# Patient Record
Sex: Female | Born: 1955 | Race: White | Hispanic: No | Marital: Married | Smoking: Current every day smoker
Health system: Southern US, Community
[De-identification: ages and names within clinical notes are randomized; demographics above are authoritative.]

---

## 2007-08-26 ENCOUNTER — Ambulatory Visit: Payer: Self-pay | Admitting: Gastroenterology

## 2009-08-25 IMAGING — CR DG CHEST 2V
1 series · 2 of 2 positions shown · non-contrast
Comparison: none

REASON FOR EXAM: esophageal foreign body
COMMENTS:

[Series 1: view not recorded · 0.17mm/px · 2 of 2 slices shown]
[im 1/2]
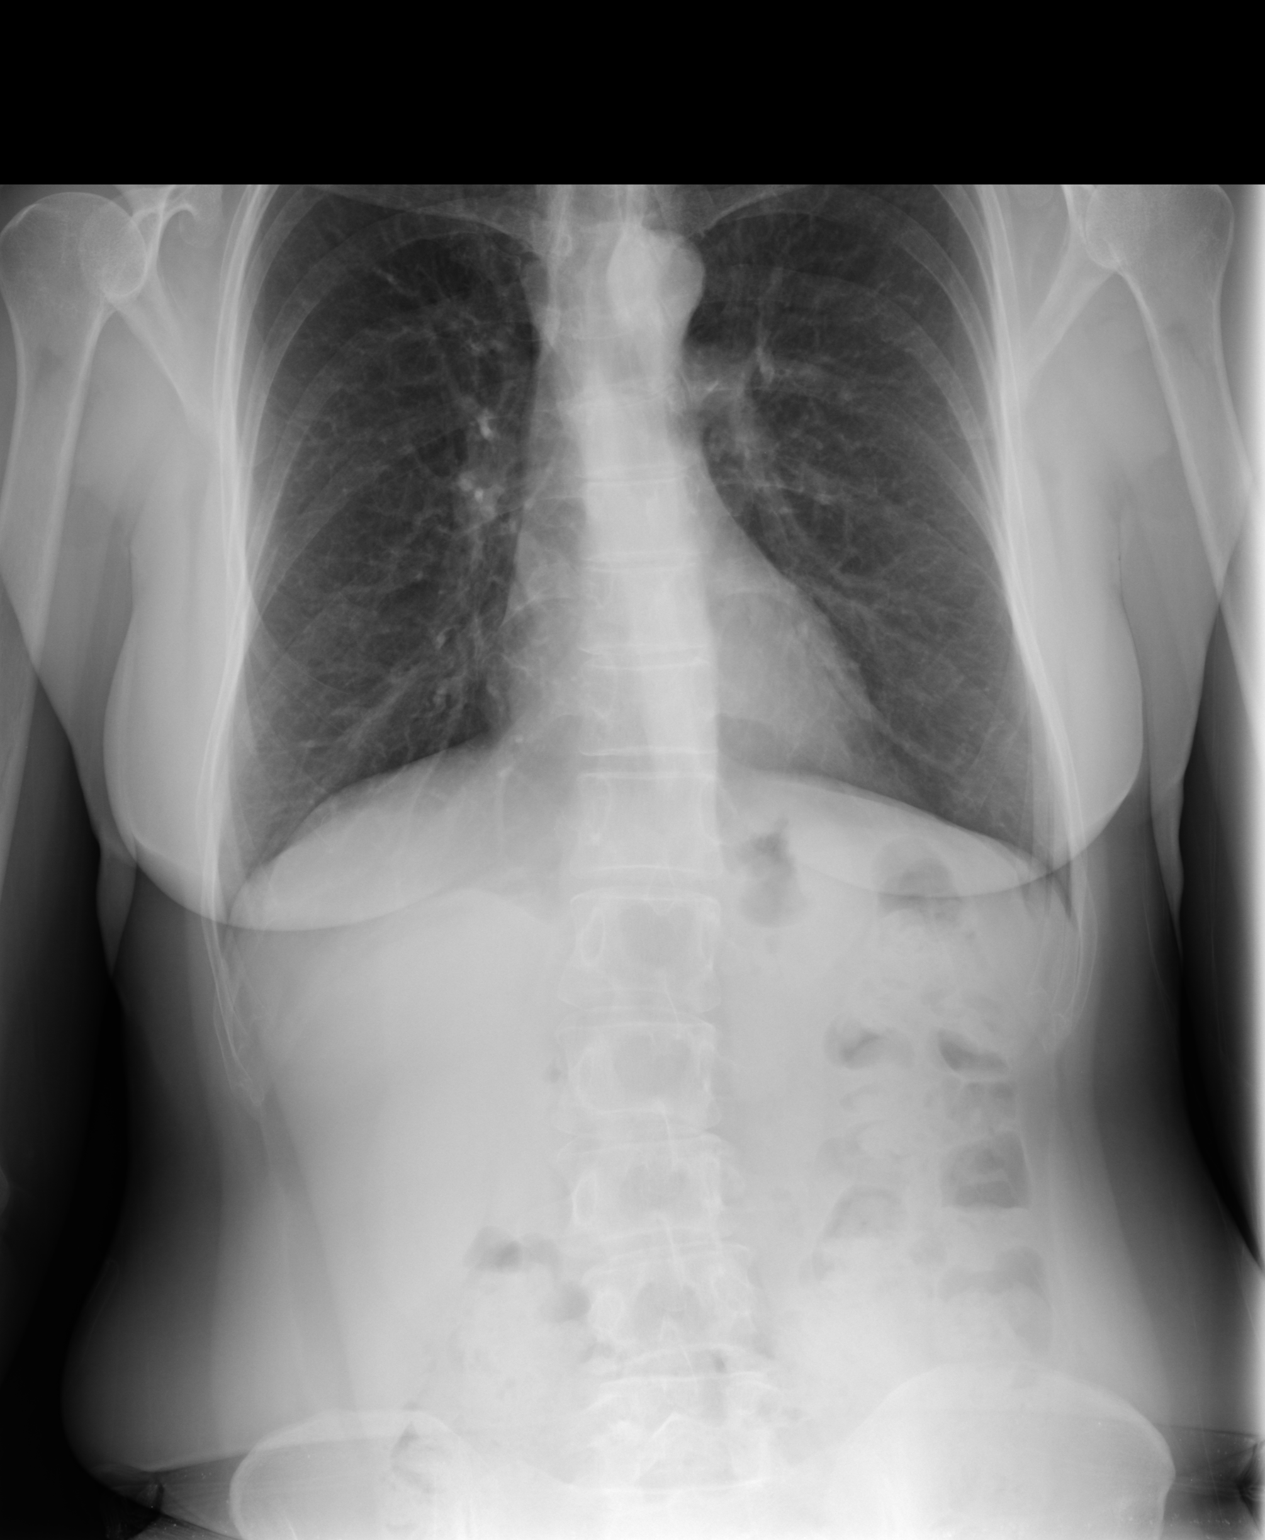
[im 2/2]
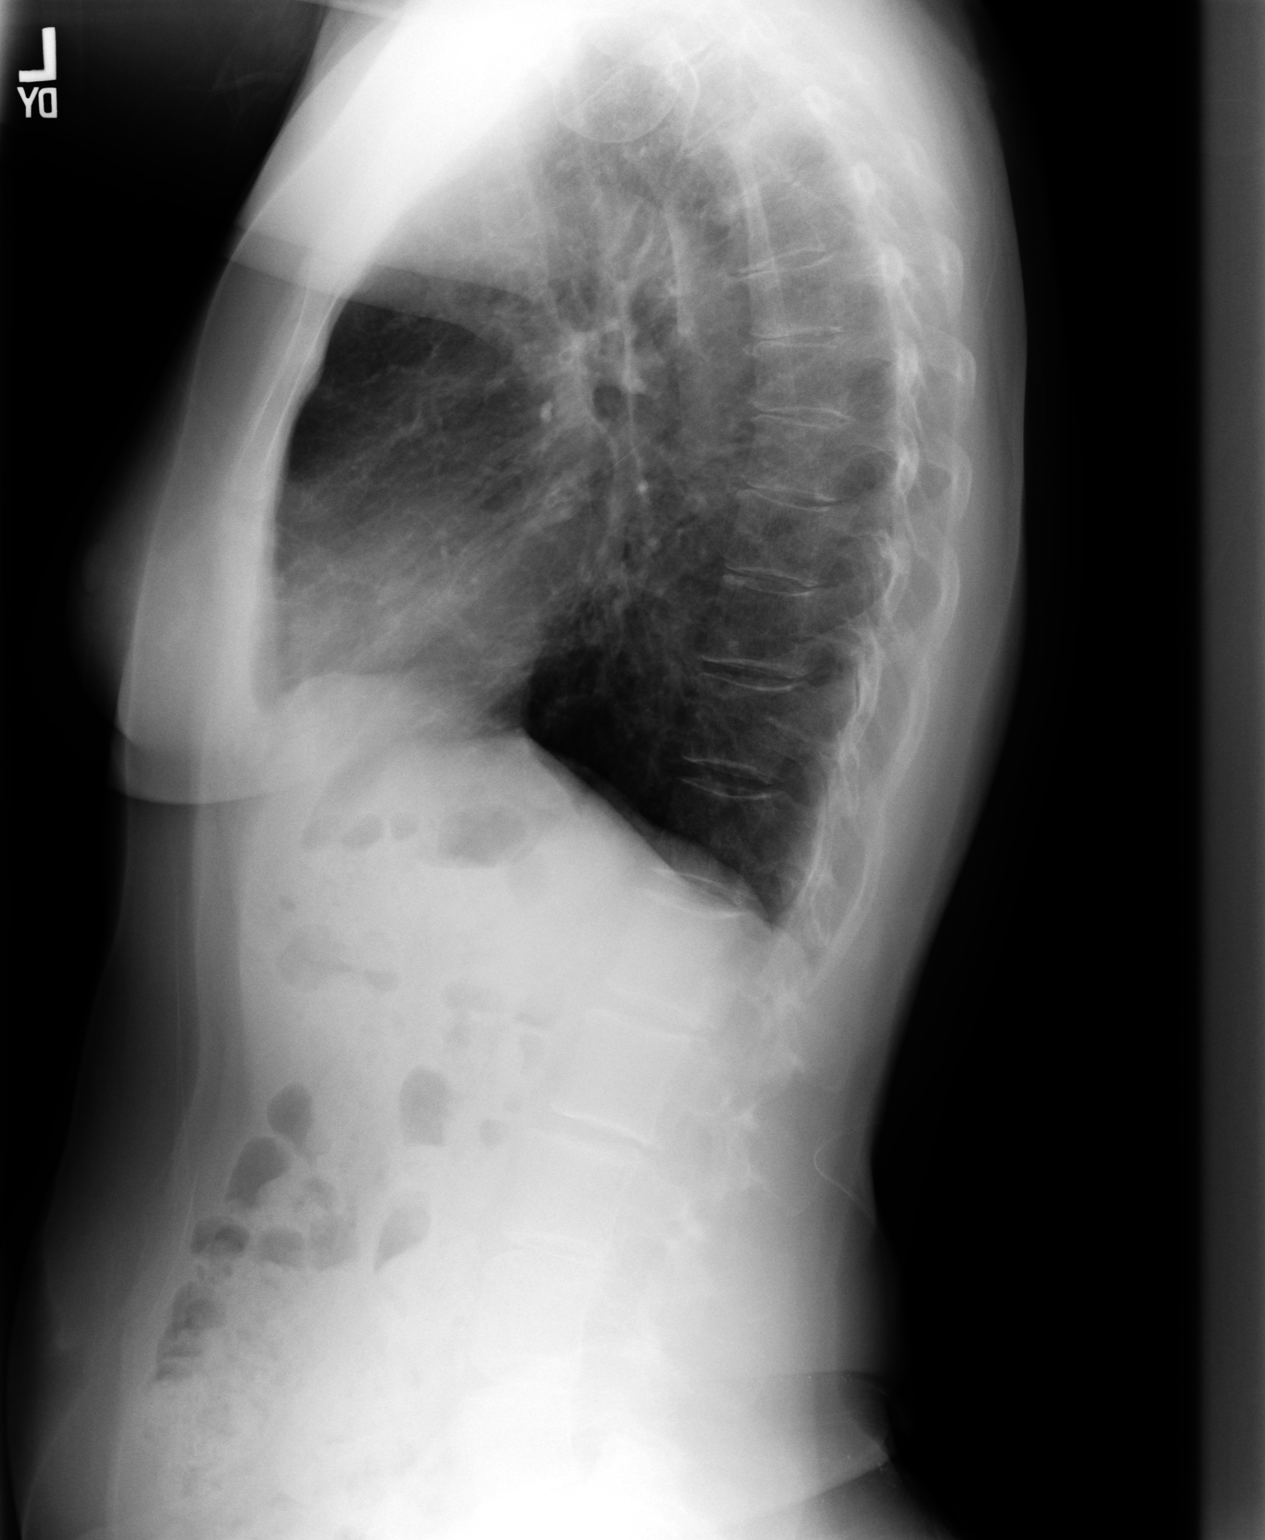

[2 of 2 positions shown; findings below may reference images not displayed]

PROCEDURE:     DXR - DXR CHEST PA (OR AP) AND LATERAL  - August 26, 2007  [DATE]

RESULT:     Mild infiltrate in the RIGHT middle lobe cannot be excluded.
Mild increased markings are noted in this region.  These changes may just be
related to mild atelectasis.  Follow up PA and lateral chest x-ray should be
considered. Cardiovascular structures are unremarkable.
IMPRESSION: Cannot exclude a very mild infiltrate versus atelectasis in the RIGHT middle
lobe. Follow-up PA and lateral chest x-ray is suggested.

## 2022-09-10 ENCOUNTER — Encounter: Payer: Self-pay | Admitting: Intensive Care

## 2022-09-10 ENCOUNTER — Observation Stay
Admission: EM | Admit: 2022-09-10 | Discharge: 2022-09-11 | Disposition: A | Discharge status: Home / Self Care | Payer: Medicare HMO | Attending: Osteopathic Medicine | Admitting: Osteopathic Medicine

## 2022-09-10 ENCOUNTER — Emergency Department: Payer: Medicare HMO

## 2022-09-10 ENCOUNTER — Observation Stay: Payer: Medicare HMO

## 2022-09-10 ENCOUNTER — Other Ambulatory Visit: Payer: Self-pay

## 2022-09-10 DIAGNOSIS — E782 Mixed hyperlipidemia: Secondary | ICD-10-CM | POA: Insufficient documentation

## 2022-09-10 DIAGNOSIS — R7309 Other abnormal glucose: Secondary | ICD-10-CM | POA: Diagnosis present

## 2022-09-10 DIAGNOSIS — I6782 Cerebral ischemia: Secondary | ICD-10-CM | POA: Insufficient documentation

## 2022-09-10 DIAGNOSIS — Z79899 Other long term (current) drug therapy: Secondary | ICD-10-CM | POA: Diagnosis not present

## 2022-09-10 DIAGNOSIS — E785 Hyperlipidemia, unspecified: Secondary | ICD-10-CM | POA: Insufficient documentation

## 2022-09-10 DIAGNOSIS — I639 Cerebral infarction, unspecified: Principal | ICD-10-CM | POA: Insufficient documentation

## 2022-09-10 DIAGNOSIS — R202 Paresthesia of skin: Secondary | ICD-10-CM | POA: Diagnosis not present

## 2022-09-10 DIAGNOSIS — R29818 Other symptoms and signs involving the nervous system: Secondary | ICD-10-CM | POA: Diagnosis not present

## 2022-09-10 DIAGNOSIS — K219 Gastro-esophageal reflux disease without esophagitis: Secondary | ICD-10-CM | POA: Diagnosis present

## 2022-09-10 DIAGNOSIS — F17210 Nicotine dependence, cigarettes, uncomplicated: Secondary | ICD-10-CM | POA: Diagnosis not present

## 2022-09-10 DIAGNOSIS — F419 Anxiety disorder, unspecified: Secondary | ICD-10-CM | POA: Diagnosis not present

## 2022-09-10 DIAGNOSIS — K2100 Gastro-esophageal reflux disease with esophagitis, without bleeding: Secondary | ICD-10-CM | POA: Diagnosis not present

## 2022-09-10 DIAGNOSIS — R7303 Prediabetes: Secondary | ICD-10-CM | POA: Diagnosis not present

## 2022-09-10 DIAGNOSIS — I10 Essential (primary) hypertension: Secondary | ICD-10-CM | POA: Diagnosis not present

## 2022-09-10 DIAGNOSIS — R0981 Nasal congestion: Secondary | ICD-10-CM | POA: Diagnosis not present

## 2022-09-10 DIAGNOSIS — R531 Weakness: Secondary | ICD-10-CM

## 2022-09-10 DIAGNOSIS — R2990 Unspecified symptoms and signs involving the nervous system: Secondary | ICD-10-CM

## 2022-09-10 DIAGNOSIS — I6523 Occlusion and stenosis of bilateral carotid arteries: Secondary | ICD-10-CM | POA: Diagnosis not present

## 2022-09-10 LAB — CBC
HCT: 43.5 % (ref 36.0–46.0)
Hemoglobin: 14.3 g/dL (ref 12.0–15.0)
MCH: 30 pg (ref 26.0–34.0)
MCHC: 32.9 g/dL (ref 30.0–36.0)
MCV: 91.4 fL (ref 80.0–100.0)
Platelets: 246 10*3/uL (ref 150–400)
RBC: 4.76 MIL/uL (ref 3.87–5.11)
RDW: 13 % (ref 11.5–15.5)
WBC: 7.1 10*3/uL (ref 4.0–10.5)
nRBC: 0 % (ref 0.0–0.2)

## 2022-09-10 LAB — COMPREHENSIVE METABOLIC PANEL
ALT: 22 U/L (ref 0–44)
AST: 28 U/L (ref 15–41)
Albumin: 4.3 g/dL (ref 3.5–5.0)
Alkaline Phosphatase: 125 U/L (ref 38–126)
Anion gap: 9 (ref 5–15)
BUN: 11 mg/dL (ref 8–23)
CO2: 24 mmol/L (ref 22–32)
Calcium: 9.2 mg/dL (ref 8.9–10.3)
Chloride: 102 mmol/L (ref 98–111)
Creatinine, Ser: 0.76 mg/dL (ref 0.44–1.00)
GFR, Estimated: >60 mL/min (ref >60)
Glucose, Bld: 112 mg/dL — ABNORMAL HIGH (ref 70–99)
Potassium: 4 mmol/L (ref 3.5–5.1)
Sodium: 135 mmol/L (ref 135–145)
Total Bilirubin: 0.8 mg/dL (ref 0.3–1.2)
Total Protein: 7.4 g/dL (ref 6.5–8.1)

## 2022-09-10 LAB — MAGNESIUM: Magnesium: 2.7 mg/dL — ABNORMAL HIGH (ref 1.7–2.4)

## 2022-09-10 MED ORDER — BISACODYL 10 MG RE SUPP: 10.0000 mg | Freq: Every day | RECTAL | Status: DC | PRN

## 2022-09-10 MED ORDER — ONDANSETRON HCL 4 MG/2ML IJ SOLN: 4.0000 mg | Freq: Four times a day (QID) | INTRAMUSCULAR | Status: DC | PRN

## 2022-09-10 MED ORDER — SALINE SPRAY 0.65 % NA SOLN
1.0000 | NASAL | Status: DC | PRN
  Administered 2022-09-10: 1 via NASAL
  Filled 2022-09-10: qty 44

## 2022-09-10 MED ORDER — CLOPIDOGREL BISULFATE 75 MG PO TABS
300.0000 mg | ORAL_TABLET | Freq: Once | ORAL | Status: AC
  Administered 2022-09-10: 300 mg via ORAL
  Filled 2022-09-10: qty 4

## 2022-09-10 MED ORDER — ALUM & MAG HYDROXIDE-SIMETH 200-200-20 MG/5ML PO SUSP
30.0000 mL | Freq: Once | ORAL | Status: AC | PRN
  Administered 2022-09-10: 30 mL via ORAL
  Filled 2022-09-10: qty 30

## 2022-09-10 MED ORDER — GABAPENTIN 100 MG PO CAPS
100.0000 mg | ORAL_CAPSULE | Freq: Every day | ORAL | Status: DC
  Filled 2022-09-10: qty 1

## 2022-09-10 MED ORDER — CLOPIDOGREL BISULFATE 75 MG PO TABS
75.0000 mg | ORAL_TABLET | Freq: Every day | ORAL | Status: DC
  Administered 2022-09-11: 75 mg via ORAL
  Filled 2022-09-10: qty 1

## 2022-09-10 MED ORDER — SODIUM CHLORIDE 0.9 % IV SOLN: 250.0000 mL | INTRAVENOUS | Status: DC | PRN

## 2022-09-10 MED ORDER — SODIUM CHLORIDE 0.9 % IV SOLN
6.2500 mg | Freq: Once | INTRAVENOUS | Status: AC
  Administered 2022-09-10: 6.25 mg via INTRAVENOUS
  Filled 2022-09-10: qty 0.25

## 2022-09-10 MED ORDER — ASPIRIN 81 MG PO CHEW
324.0000 mg | CHEWABLE_TABLET | Freq: Once | ORAL | Status: AC
  Administered 2022-09-10: 324 mg via ORAL
  Filled 2022-09-10: qty 4

## 2022-09-10 MED ORDER — ONDANSETRON HCL 4 MG PO TABS: 4.0000 mg | ORAL_TABLET | Freq: Four times a day (QID) | ORAL | Status: DC | PRN

## 2022-09-10 MED ORDER — ACETAMINOPHEN 650 MG RE SUPP: 650.0000 mg | Freq: Four times a day (QID) | RECTAL | Status: DC | PRN

## 2022-09-10 MED ORDER — ACETAMINOPHEN 325 MG PO TABS: 650.0000 mg | ORAL_TABLET | Freq: Four times a day (QID) | ORAL | Status: DC | PRN

## 2022-09-10 MED ORDER — SODIUM CHLORIDE 0.9% FLUSH: 3.0000 mL | INTRAVENOUS | Status: DC | PRN

## 2022-09-10 MED ORDER — DIAZEPAM 5 MG PO TABS
5.0000 mg | ORAL_TABLET | Freq: Once | ORAL | Status: AC
  Administered 2022-09-10: 5 mg via ORAL
  Filled 2022-09-10: qty 1

## 2022-09-10 MED ORDER — ENOXAPARIN SODIUM 40 MG/0.4ML IJ SOSY
40.0000 mg | PREFILLED_SYRINGE | INTRAMUSCULAR | Status: DC
  Administered 2022-09-10: 40 mg via SUBCUTANEOUS
  Filled 2022-09-10: qty 0.4

## 2022-09-10 MED ORDER — POLYETHYLENE GLYCOL 3350 17 G PO PACK: 17.0000 g | PACK | Freq: Every day | ORAL | Status: DC | PRN

## 2022-09-10 MED ORDER — STROKE: EARLY STAGES OF RECOVERY BOOK: Freq: Once | Status: AC

## 2022-09-10 MED ORDER — PANTOPRAZOLE SODIUM 40 MG IV SOLR
40.0000 mg | Freq: Once | INTRAVENOUS | Status: AC
  Administered 2022-09-10: 40 mg via INTRAVENOUS
  Filled 2022-09-10: qty 10

## 2022-09-10 MED ORDER — SODIUM CHLORIDE 0.9% FLUSH
3.0000 mL | Freq: Two times a day (BID) | INTRAVENOUS | Status: DC
  Administered 2022-09-10 (×2): 3 mL via INTRAVENOUS

## 2022-09-10 MED ORDER — IOHEXOL 350 MG/ML SOLN
75.0000 mL | Freq: Once | INTRAVENOUS | Status: AC | PRN
  Administered 2022-09-10: 75 mL via INTRAVENOUS

## 2022-09-10 MED ORDER — CLONAZEPAM 0.5 MG PO TABS: 1.0000 mg | ORAL_TABLET | Freq: Three times a day (TID) | ORAL | Status: DC | PRN

## 2022-09-10 NOTE — ED Notes (Addendum)
Pt used walker to walk to toilet, when returning to stretcher pt fell and landed on bottom. This RN was walking by as it happened and assisted pt up with another RN.  She states she is "fine" but endorses pain in her "butt cheek" per pt. No obvious deformities, in NAD. MD notified.

## 2022-09-10 NOTE — H&P (Signed)
HISTORY AND PHYSICAL    Kellie Johnson   [id number] DOB: 09/20/1955   Date of Service: 09/10/22 Requesting physician/APP from ED: Treatment Team:  Attending Provider: Emeterio Reeve, DO  PCP: Patient, No Pcp Per     HPI: Kellie Johnson is a 67 y.o. female without significant PMH d/t does not like going to doctors. She presents after 3 weeks ago numbness/weakness on L side which spontaneously resolved <24h, then recurred yesterday evening and persists until now. She presented to ED 09/10/22, given ASA< CT head negative, L sided weakness and was admitted to hospitalist service     Consultants:  Neurology   Procedures: none  MRI Brain 09/10/22  IMPRESSION: 1. Motion degraded, incomplete examination. 2. 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 3. Moderate chronic small vessel ischemic disease.   CTA H/N 09/10/22  IMPRESSION:  Patent vasculature of the head and neck with mild calcified plaque at the left carotid bifurcation and bilateral carotid siphons but no hemodynamically significant stenosis, occlusion, or dissection     ASSESSMENT & PLAN:   Principal Problem:   Stroke [facility])  Stroke MRI and CTA H/N as above ASA + Plavix Neuro checks and fall precautions PT/OT to see  Echo pending Remain on telemetry  Risk stratification: A1C, Lipids Permissive HTN Neurology consult tomorrow   GERD PPI given today GI cocktail as needed  Anxiety Requiring pre-medication for MRI Prn anxiolytic also ordered  Nasal congestion Saline spray prn      DVT prophylaxis: lovenox Pertinent IV fluids/nutrition: no IV fluids  Central lines / invasive devices: none  Code Status: FULL CODE Family Communication: daughter at bedside on admission  Disposition: observation TOC needs: pend PT/OT Barriers to discharge / significant pending items: echo, telemetry, neurology consult                    Review of Systems:  Review of Systems  Constitutional:  Negative for chills, fever, malaise/fatigue and weight loss.  HENT:  Positive for congestion and sinus pain. Negative for sore throat.   Respiratory:  Negative for cough, hemoptysis, sputum production, shortness of breath and wheezing.   Cardiovascular:  Negative for chest pain, palpitations, orthopnea, claudication and leg swelling.  Gastrointestinal:  Positive for heartburn. Negative for abdominal pain, constipation, diarrhea, nausea and vomiting.  Genitourinary:  Negative for dysuria and frequency.  Musculoskeletal:  Positive for falls.  Skin:  Negative for rash.  Neurological:  Positive for tingling, sensory change, focal weakness and weakness. Negative for dizziness, tremors, speech change, seizures, loss of consciousness and headaches.  Psychiatric/Behavioral:  Negative for depression and suicidal ideas. The patient is nervous/anxious.        has no past medical history on file. (Not in an outpatient encounter)   No Known Allergies    family history is not on file. History reviewed. No pertinent surgical history.        Objective Findings:  Vitals:   09/10/22 0940 09/10/22 1245 09/10/22 1409 09/10/22 1612  BP:  (!) 164/78 (!) 143/73 135/85  Pulse:   93 89  Resp:  '20 20 16  '$ Temp:   98.2 F (36.8 C) 98 F (36.7 C)  TempSrc:      SpO2:  98% 97% 94%  Weight: 61.2 kg     Height: 5' (1.524 m)       Intake/Output Summary (Last 24 hours) at 09/10/2022 1831 Last data filed at 09/10/2022 1632 Gross per  24 hour  Intake 50 ml  Output --  Net 50 ml   Filed Weights   09/10/22 0940  Weight: 61.2 kg    Examination:  Physical Exam Constitutional:      General: She is not in acute distress.    Appearance: Normal appearance.  HENT:     Head: Normocephalic and atraumatic.  Eyes:     Extraocular Movements: Extraocular movements intact.     Pupils: Pupils are equal, round, and  reactive to light.  Cardiovascular:     Rate and Rhythm: Normal rate and regular rhythm.     Pulses: Normal pulses.     Heart sounds: Normal heart sounds.  Pulmonary:     Effort: Pulmonary effort is normal. No respiratory distress.     Breath sounds: Normal breath sounds.  Abdominal:     General: Abdomen is flat. Bowel sounds are normal.     Palpations: Abdomen is soft.  Musculoskeletal:     Cervical back: Normal range of motion. No rigidity.     Right lower leg: No edema.     Left lower leg: No edema.  Neurological:     Mental Status: She is alert and oriented to person, place, and time.     Cranial Nerves: No cranial nerve deficit.     Sensory: Sensory deficit (reports more numb on her L lateral leg) present.     Motor: Weakness (LLE significant) present.  Psychiatric:        Behavior: Behavior normal.          Scheduled Medications:   [START ON 09/11/2022]  stroke: early stages of recovery book   Does not apply Once   enoxaparin (LOVENOX) injection  40 mg Subcutaneous Q24H   sodium chloride flush  3 mL Intravenous Q12H    Continuous Infusions:  sodium chloride      PRN Medications:  sodium chloride, acetaminophen **OR** acetaminophen, bisacodyl, ondansetron **OR** ondansetron (ZOFRAN) IV, polyethylene glycol, sodium chloride, sodium chloride flush  Antimicrobials:  Anti-infectives (From admission, onward)    None           Data Reviewed: I have personally reviewed following labs and imaging studies  CBC: Recent Labs  Lab 09/10/22 1001  WBC 7.1  HGB 14.3  HCT 43.5  MCV 91.4  PLT 0000000   Basic Metabolic Panel: Recent Labs  Lab 09/10/22 1001  NA 135  K 4.0  CL 102  CO2 24  GLUCOSE 112*  BUN 11  CREATININE 0.76  CALCIUM 9.2  MG 2.7*   GFR: Estimated Creatinine Clearance: 55.8 mL/min (by C-G formula based on SCr of 0.76 mg/dL). Liver Function Tests: Recent Labs  Lab 09/10/22 1001  AST 28  ALT 22  ALKPHOS 125  BILITOT 0.8  PROT 7.4   ALBUMIN 4.3   No results for input(s): "LIPASE", "AMYLASE" in the last 168 hours. No results for input(s): "AMMONIA" in the last 168 hours. Coagulation Profile: No results for input(s): "INR", "PROTIME" in the last 168 hours. Cardiac Enzymes: No results for input(s): "CKTOTAL", "CKMB", "CKMBINDEX", "TROPONINI" in the last 168 hours. BNP (last 3 results) No results for input(s): "PROBNP" in the last 8760 hours. HbA1C: No results for input(s): "HGBA1C" in the last 72 hours. CBG: No results for input(s): "GLUCAP" in the last 168 hours. Lipid Profile: No results for input(s): "CHOL", "HDL", "LDLCALC", "TRIG", "CHOLHDL", "LDLDIRECT" in the last 72 hours. Thyroid Function Tests: No results for input(s): "TSH", "T4TOTAL", "FREET4", "T3FREE", "THYROIDAB" in the last 72 hours.  Anemia Panel: No results for input(s): "VITAMINB12", "FOLATE", "FERRITIN", "TIBC", "IRON", "RETICCTPCT" in the last 72 hours. Most Recent Urinalysis On File:  No results found for: "COLORURINE", "APPEARANCEUR", "LABSPEC", "PHURINE", "GLUCOSEU", "HGBUR", "BILIRUBINUR", "KETONESUR", "PROTEINUR", "UROBILINOGEN", "NITRITE", "LEUKOCYTESUR" Sepsis Labs: '@LABRCNTIP'$ (procalcitonin:4,lacticidven:4)  No results found for this or any previous visit (from the past 240 hour(s)).       Radiology Studies: MR BRAIN WO CONTRAST  Result Date: 09/10/2022 CLINICAL DATA:  Neuro deficit, acute, stroke suspected. Left hemibody paresthesias. EXAM: MRI HEAD WITHOUT CONTRAST TECHNIQUE: Multiplanar, multiecho pulse sequences of the brain and surrounding structures were obtained without intravenous contrast. COMPARISON:  Head CT and CTA 09/10/2022 FINDINGS: The patient terminated the examination prior to completion. Axial and coronal diffusion, axial T2, axial FLAIR, and T2* gradient echo sequences were obtained and are mildly motion degraded. Brain: There is a 1 cm focus of mildly restricted diffusion involving the posterior limb of the right  internal capsule and lateral thalamus consistent with an acute to early subacute infarct. Patchy T2 hyperintensities elsewhere in the cerebral white matter bilaterally are nonspecific but compatible with moderate chronic small vessel ischemic disease, asymmetrically prominent in the left frontal lobe. No intracranial hemorrhage, mass effect, or extra-axial fluid collection is identified. The ventricles and sulci are within normal limits for age. Vascular: Major intracranial vascular flow voids are preserved. Skull and upper cervical spine: No destructive skull lesion. Sinuses/Orbits: Unremarkable orbits. Small mucous retention cyst in the right sphenoid sinus. Trace left mastoid effusion. Other: None. IMPRESSION: 1. Motion degraded, incomplete examination. 2. 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 3. Moderate chronic small vessel ischemic disease. Electronically Signed   By: Logan Bores M.D.   On: 09/10/2022 17:27   CT ANGIO HEAD NECK W WO CM  Result Date: 09/10/2022 CLINICAL DATA:  Weakness and numbness. EXAM: CT ANGIOGRAPHY HEAD AND NECK TECHNIQUE: Multidetector CT imaging of the head and neck was performed using the standard protocol during bolus administration of intravenous contrast. Multiplanar CT image reconstructions and MIPs were obtained to evaluate the vascular anatomy. Carotid stenosis measurements (when applicable) are obtained utilizing NASCET criteria, using the distal internal carotid diameter as the denominator. RADIATION DOSE REDUCTION: This exam was performed according to the departmental dose-optimization program which includes automated exposure control, adjustment of the mA and/or kV according to patient size and/or use of iterative reconstruction technique. CONTRAST:  49m OMNIPAQUE IOHEXOL 350 MG/ML SOLN COMPARISON:  Same-day noncontrast CT head FINDINGS: CTA NECK FINDINGS Aortic arch: The aortic arch is normal. The origins  of the major branch vessels are patent. The subclavian arteries are patent to the level imaged. Right carotid system: The right common, internal, and external carotid arteries are patent, without hemodynamically significant stenosis or occlusion. There is no evidence of dissection or aneurysm. There is mild tortuosity of the internal carotid artery in the neck. Left carotid system: The left common, internal, and external carotid arteries are patent, with mild plaque at the bifurcation resulting in less than 50% stenosis. The distal internal carotid artery is patent with tortuosity. The external carotid artery is patent. There is no evidence of dissection or aneurysm. Vertebral arteries: The vertebral arteries are patent, without hemodynamically significant stenosis or occlusion there is no evidence of dissection or aneurysm. Skeleton: There is degenerative change of the cervical spine with 4 mm anterolisthesis of C2 on C3 and multilevel disc space narrowing and degenerative endplate change, most advanced at C3-C4 and C6-C7. There is no  acute osseous abnormality or suspicious osseous lesion. There is no visible canal hematoma. Other neck: The soft tissues of the neck are unremarkable. Upper chest: There is no acute finding in the lung apices. There is mild scarring/bullous change along the medial right lung apex. Review of the MIP images confirms the above findings CTA HEAD FINDINGS Anterior circulation: There is mild calcified plaque in the carotid siphons without significant stenosis or occlusion. The bilateral MCAs are patent, without proximal stenosis or occlusion. The bilateral ACAs are patent, without proximal stenosis or occlusion. The anterior communicating artery is normal. There is no aneurysm or AVM. Posterior circulation: The bilateral V4 segments are patent. The basilar artery is patent. The major cerebellar arteries appear patent. The bilateral PCAs are patent, without proximal stenosis or occlusion.  Bilateral posterior communicating arteries are identified. There is no aneurysm or AVM. Venous sinuses: As permitted by contrast timing, patent. Anatomic variants: None. Review of the MIP images confirms the above findings IMPRESSION: Patent vasculature of the head and neck with mild calcified plaque at the left carotid bifurcation and bilateral carotid siphons but no hemodynamically significant stenosis, occlusion, or dissection. Electronically Signed   By: Valetta Mole M.D.   On: 09/10/2022 13:00   CT Head Wo Contrast  Result Date: 09/10/2022 CLINICAL DATA:  Neuro deficit, acute, stroke suspected EXAM: CT HEAD WITHOUT CONTRAST TECHNIQUE: Contiguous axial images were obtained from the base of the skull through the vertex without intravenous contrast. RADIATION DOSE REDUCTION: This exam was performed according to the departmental dose-optimization program which includes automated exposure control, adjustment of the mA and/or kV according to patient size and/or use of iterative reconstruction technique. COMPARISON:  None Available. FINDINGS: Brain: No evidence of acute large vascular territory infarction, hemorrhage, hydrocephalus, extra-axial collection or mass lesion/mass effect. Vascular: No hyperdense vessel. Skull: No acute fracture. Sinuses/Orbits: Clear sinuses.  No acute orbital findings. Other: No mastoid effusions. IMPRESSION: No evidence of acute intracranial abnormality. Electronically Signed   By: Margaretha Sheffield M.D.   On: 09/10/2022 10:39             LOS: 0 days       Emeterio Reeve, DO [facility] 09/10/2022, 6:31 PM    Dictation software may have been used to generate the above note. Typos may occur and escape review in typed/dictated notes. Please contact Dr Sheppard Coil directly for clarity if needed.  Staff may message me via secure chat in [place]  but this may not receive an immediate response,  please page me for urgent matters!  If 7PM-7AM, please contact  night coverage [email or web address]

## 2022-09-10 NOTE — ED Triage Notes (Signed)
Patient reports 3 weeks ago her left arm and leg went numb and relieved with aleve. Last night around 7:30pm her left arm and leg went numb again and is still currently numb. Reports she had to crawl to restroom because she could not ambulate with her walker due to numbness.

## 2022-09-10 NOTE — ED Notes (Signed)
First nurse note: Pt here from [facility] with left sided weakness and numbness. Pt states this happened 3 weeks ago as well and went away. Pt states s/s started at [place] last night and pt went to bed afterwards and woke up with same s/s. Pt states Aleve is helping the pain. Pt is A&Ox4. NAD noted.

## 2022-09-10 NOTE — ED Provider Notes (Signed)
[facility] Provider Note    Event Date/Time   First MD Initiated Contact with Patient 09/10/22 [phone]     (approximate)   History   Numbness   HPI  Kellie Johnson is a 67 y.o. female with no significant past medical history who presents to the emergency department with paresthesias to the left hemibody.  Patient states that 3 weeks ago she developed numbness and tingling sensation to her left hemibody.  Use warm compresses and states that her symptoms progressively improved with warm compresses and aspirin over the next couple of weeks.  States that she was no longer having paresthesias and they returned again last night at 7 PM.  Endorses numbness and tingling to the left arm and leg.  No recent falls or trauma.  No headache.  Denies change in vision or slurring of speech.  Denies any extremity weakness.  Does not take any daily medications and states that she otherwise has a distrust for doctors and medicine.  States that she is willing to be admitted to the hospital for further stroke workup.     Physical Exam   Triage Vital Signs: ED Triage Vitals  Enc Vitals Group     BP 09/10/22 0937 125/66     Pulse Rate 09/10/22 0937 79     Resp 09/10/22 0937 16     Temp 09/10/22 0937 98.2 F (36.8 C)     Temp Source 09/10/22 0937 Oral     SpO2 09/10/22 0937 92 %     Weight 09/10/22 0940 135 lb (61.2 kg)     Height 09/10/22 0940 5' (1.524 m)     Head Circumference --      Peak Flow --      Pain Score 09/10/22 0940 0     Pain Loc --      Pain Edu? --      Excl. in [place]? --     Most recent vital signs: Vitals:   09/10/22 0937  BP: 125/66  Pulse: 79  Resp: 16  Temp: 98.2 F (36.8 C)  SpO2: 92%    Physical Exam Constitutional:      Appearance: She is well-developed.  HENT:     Head: Atraumatic.  Eyes:     Extraocular Movements: Extraocular movements intact.     Conjunctiva/sclera: Conjunctivae normal.  Cardiovascular:     Rate and Rhythm:  Regular rhythm.  Pulmonary:     Effort: No respiratory distress.  Abdominal:     General: There is no distension.  Musculoskeletal:        General: Normal range of motion.     Cervical back: Normal range of motion.  Skin:    General: Skin is warm.  Neurological:     Mental Status: She is alert.     GCS: GCS eye subscore is 4. GCS verbal subscore is 5. GCS motor subscore is 6.     Cranial Nerves: Cranial nerves 2-12 are intact.     Sensory: Sensory deficit present.     Motor: Weakness (4+/5 LLE, 5/5 RLE) and pronator drift (Left pronator drift) present.     Comments: Decree sensation but intact to the left upper and lower extremity     IMPRESSION / MDM / [place] / ED COURSE  I reviewed the triage vital signs and the nursing notes.  Differential diagnosis including intracranial hemorrhage, CVA, electrolyte abnormalities.  Patient with an NIH scale of 3.  Patient is outside of the  window for TNK.  Low risk for LVO, do not feel that CT angiography is necessary at this time.   EKG  I, Nathaniel Man, the attending physician, personally viewed and interpreted this ECG.   Rate: Normal  Rhythm: Normal sinus  Axis: Normal  Intervals: Normal  ST&T Change: None  No tachycardic or bradycardic dysrhythmias while on cardiac telemetry.  RADIOLOGY I independently reviewed imaging, my interpretation of imaging: CT scan of the head with no signs of intracranial hemorrhage or infarction.  Read as no acute findings.  LABS (all labs ordered are listed, but only abnormal results are displayed) Labs interpreted as -    Labs Reviewed  COMPREHENSIVE METABOLIC PANEL - Abnormal; Notable for the following components:      Result Value   Glucose, Bld 112 (*)    All other components within normal limits  MAGNESIUM - Abnormal; Notable for the following components:   Magnesium 2.7 (*)    All other components within normal limits  CBC    TREATMENT  Aspirin 325  MDM   Patient  with focal deficits of left-sided paresthesias and weakness with a pronator drift on exam.  Patient is outside of the window for TNK.  Clinical picture most concerning for CVA.  Elevated magnesium level.  No daily medications.  No other significant electrolyte abnormalities.  Consulted hospitalist for admission for CVA.  PROCEDURES:  Critical Care performed: No  Procedures  Patient's presentation is most consistent with acute presentation with potential threat to life or bodily function.   MEDICATIONS ORDERED IN ED: Medications  aspirin chewable tablet 324 mg (324 mg Oral Given 09/10/22 1109)    FINAL CLINICAL IMPRESSION(S) / ED DIAGNOSES   Final diagnoses:  Paresthesias  Weakness  Stroke-like symptoms     Rx / DC Orders   ED Discharge Orders     None        Note:  This document was prepared using Dragon voice recognition software and may include unintentional dictation errors.   Nathaniel Man, MD 09/10/22 1110

## 2022-09-11 ENCOUNTER — Observation Stay (HOSPITAL_BASED_OUTPATIENT_CLINIC_OR_DEPARTMENT_OTHER)
Admit: 2022-09-11 | Discharge: 2022-09-11 | Disposition: A | Discharge status: Home / Self Care | Payer: Medicare HMO | Attending: Osteopathic Medicine | Admitting: Osteopathic Medicine

## 2022-09-11 DIAGNOSIS — K2100 Gastro-esophageal reflux disease with esophagitis, without bleeding: Secondary | ICD-10-CM

## 2022-09-11 DIAGNOSIS — I6389 Other cerebral infarction: Secondary | ICD-10-CM | POA: Diagnosis not present

## 2022-09-11 DIAGNOSIS — E782 Mixed hyperlipidemia: Secondary | ICD-10-CM | POA: Diagnosis present

## 2022-09-11 DIAGNOSIS — R202 Paresthesia of skin: Secondary | ICD-10-CM | POA: Diagnosis not present

## 2022-09-11 DIAGNOSIS — R7309 Other abnormal glucose: Secondary | ICD-10-CM | POA: Diagnosis not present

## 2022-09-11 DIAGNOSIS — I6381 Other cerebral infarction due to occlusion or stenosis of small artery: Secondary | ICD-10-CM | POA: Diagnosis not present

## 2022-09-11 DIAGNOSIS — F17210 Nicotine dependence, cigarettes, uncomplicated: Secondary | ICD-10-CM

## 2022-09-11 DIAGNOSIS — I639 Cerebral infarction, unspecified: Secondary | ICD-10-CM | POA: Diagnosis not present

## 2022-09-11 DIAGNOSIS — K219 Gastro-esophageal reflux disease without esophagitis: Secondary | ICD-10-CM | POA: Diagnosis present

## 2022-09-11 DIAGNOSIS — R531 Weakness: Secondary | ICD-10-CM

## 2022-09-11 LAB — LIPID PANEL
Cholesterol: 300 mg/dL — ABNORMAL HIGH (ref 0–200)
HDL: 46 mg/dL (ref >40)
LDL Cholesterol: 184 mg/dL — ABNORMAL HIGH (ref 0–99)
Total CHOL/HDL Ratio: 6.5 RATIO
Triglycerides: 351 mg/dL — ABNORMAL HIGH (ref <150)
VLDL: 70 mg/dL — ABNORMAL HIGH (ref 0–40)

## 2022-09-11 LAB — ECHOCARDIOGRAM COMPLETE
AR max vel: 1.55 cm2
AV Peak grad: 7 mmHg
Ao pk vel: 1.32 m/s
Area-P 1/2: 3.6 cm2
Height: 60 in
S' Lateral: 3 cm
Single Plane A4C EF: 63 %
Weight: 2160 oz

## 2022-09-11 LAB — HEMOGLOBIN A1C
Hgb A1c MFr Bld: 5.9 % — ABNORMAL HIGH (ref 4.8–5.6)
Mean Plasma Glucose: 123 mg/dL

## 2022-09-11 MED ORDER — PANTOPRAZOLE SODIUM 40 MG PO TBEC: 40.0000 mg | DELAYED_RELEASE_TABLET | Freq: Every day | ORAL | 0 refills | Status: AC

## 2022-09-11 MED ORDER — ASPIRIN 81 MG PO TBEC: 81.0000 mg | DELAYED_RELEASE_TABLET | Freq: Every day | ORAL | 0 refills | Status: AC

## 2022-09-11 MED ORDER — ROSUVASTATIN CALCIUM 20 MG PO TABS: 20.0000 mg | ORAL_TABLET | Freq: Every day | ORAL | 0 refills | Status: AC

## 2022-09-11 MED ORDER — ROSUVASTATIN CALCIUM 20 MG PO TABS: 20.0000 mg | ORAL_TABLET | Freq: Every day | ORAL | Status: DC

## 2022-09-11 MED ORDER — CLOPIDOGREL BISULFATE 75 MG PO TABS: 75.0000 mg | ORAL_TABLET | Freq: Every day | ORAL | 0 refills | Status: AC

## 2022-09-11 NOTE — Discharge Summary (Signed)
Physician Discharge Summary   Patient: Kellie Johnson MRN: [id number]  DOB: [birth year]   Admit:     Date of Admission: 09/10/2022 Admitted from: home   Discharge: Date of discharge: 09/11/22 Disposition: Home Condition at discharge: good  CODE STATUS: FULL CODE     Discharge Physician: Emeterio Reeve, DO [facility]     PCP: Patient, No Pcp Per  Recommendations for Outpatient Follow-up:  Follow up with PCP Patient, No Pcp Per in 3-4 weeks Please obtain labs/tests: CMP, lipids in 4-6 weeks, A1C in 3 mos  Please follow up on the following pending results: none PCP AND OTHER OUTPATIENT PROVIDERS: SEE BELOW FOR SPECIFIC DISCHARGE INSTRUCTIONS PRINTED FOR PATIENT IN ADDITION TO GENERIC AVS PATIENT INFO    Discharge Instructions     Increase activity slowly   Complete by: As directed          Discharge Diagnoses: Principal Problem:   Stroke [facility]) Active Problems:   GERD (gastroesophageal reflux disease)   Mixed hyperlipidemia   Abnormal glucose - A1C x1 in prediabetic range       Hospital Course: Kellie Johnson is a 67 y.o. female without significant PMH d/t does not like going to doctors. She presents after 3 weeks ago numbness/weakness on L side which spontaneously resolved <24h, then recurred yesterday evening and persists until now. She presented to ED 09/10/22, given ASA, CT head negative, L sided weakness and was admitted to hospitalist service. MRI and CTA H/N as above. No concerns on Echo. D/c on meds as below w/ appropriate DME   Consultants:  Neurology   Procedures: none  MRI Brain 09/10/22  IMPRESSION: 1. Motion degraded, incomplete examination. 2. 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 3. Moderate chronic small vessel ischemic disease.   CTA H/N 09/10/22  IMPRESSION:  Patent vasculature of the head and neck with mild calcified plaque at the left carotid  bifurcation and bilateral carotid siphons but no hemodynamically significant stenosis, occlusion, or dissection    ASSESSMENT & PLAN:   Stroke MRI and CTA H/N as above ASA + Plavix - DAPT x3 weeks then ASA monotherapy  Statin - follow CMP and lipids   HLD in setting of CVA High potency statin  Will require outpatient f/u  Abn Glc level - A1C 5.9 Will require outpatient f/u   GERD GI cocktail as needed PPI rx on discharge Follow outpatient    Anxiety Requiring pre-medication for MRI Prn anxiolytic also ordered Follow outpatient but low suspicion for GAD    Nasal congestion Saline spray prn               Discharge Instructions  Allergies as of 09/11/2022   No Known Allergies      Medication List     TAKE these medications    aspirin EC 81 MG tablet Take 1 tablet (81 mg total) by mouth daily.   clopidogrel 75 MG tablet Commonly known as: PLAVIX Take 1 tablet (75 mg total) by mouth daily for 21 days. Start taking on: September 12, 2022   pantoprazole 40 MG tablet Commonly known as: Protonix Take 1 tablet (40 mg total) by mouth daily.   rosuvastatin 20 MG tablet Commonly known as: CRESTOR Take 1 tablet (20 mg total) by mouth at bedtime. Start taking on: September 12, 2022               Durable Medical Equipment  (From admission,  onward)           Start     Ordered   09/11/22 1118  For home use only DME Walker rolling  Once       Question Answer Comment  Walker: With [place]   Patient needs a walker to treat with the following condition Generalized weakness      09/11/22 1118              No Known Allergies   Subjective: pt feeling well this mornign and eager to go home   Discharge Exam: BP (!) 134/52 (BP Location: Left Arm)   Pulse 87   Temp 98.7 F (37.1 C) (Oral)   Resp 17   Ht 5' (1.524 m)   Wt 61.2 kg   SpO2 96%   BMI 26.37 kg/m  General: Pt is alert, awake, not in acute distress Cardiovascular: RRR,  S1/S2 +, no rubs, no gallops Respiratory: CTA bilaterally, no wheezing, no rhonchi Abdominal: Soft, NT, ND, bowel sounds + Extremities: no edema, no cyanosis     The results of significant diagnostics from this hospitalization (including imaging, microbiology, ancillary and laboratory) are listed below for reference.     Microbiology: No results found for this or any previous visit (from the past 240 hour(s)).   Labs: BNP (last 3 results) No results for input(s): "BNP" in the last 8760 hours. Basic Metabolic Panel: Recent Labs  Lab 09/10/22 1001  NA 135  K 4.0  CL 102  CO2 24  GLUCOSE 112*  BUN 11  CREATININE 0.76  CALCIUM 9.2  MG 2.7*   Liver Function Tests: Recent Labs  Lab 09/10/22 1001  AST 28  ALT 22  ALKPHOS 125  BILITOT 0.8  PROT 7.4  ALBUMIN 4.3   No results for input(s): "LIPASE", "AMYLASE" in the last 168 hours. No results for input(s): "AMMONIA" in the last 168 hours. CBC: Recent Labs  Lab 09/10/22 1001  WBC 7.1  HGB 14.3  HCT 43.5  MCV 91.4  PLT 246   Cardiac Enzymes: No results for input(s): "CKTOTAL", "CKMB", "CKMBINDEX", "TROPONINI" in the last 168 hours. BNP: Invalid input(s): "POCBNP" CBG: No results for input(s): "GLUCAP" in the last 168 hours. D-Dimer No results for input(s): "DDIMER" in the last 72 hours. Hgb A1c Recent Labs    09/10/22 1001  HGBA1C 5.9*   Lipid Profile Recent Labs    09/11/22 0429  CHOL 300*  HDL 46  LDLCALC 184*  TRIG 351*  CHOLHDL 6.5   Thyroid function studies No results for input(s): "TSH", "T4TOTAL", "T3FREE", "THYROIDAB" in the last 72 hours.  Invalid input(s): "FREET3" Anemia work up No results for input(s): "VITAMINB12", "FOLATE", "FERRITIN", "TIBC", "IRON", "RETICCTPCT" in the last 72 hours. Urinalysis No results found for: "COLORURINE", "APPEARANCEUR", "LABSPEC", "PHURINE", "GLUCOSEU", "HGBUR", "BILIRUBINUR", "KETONESUR", "PROTEINUR", "UROBILINOGEN", "NITRITE", "LEUKOCYTESUR" Sepsis  Labs Recent Labs  Lab 09/10/22 1001  WBC 7.1   Microbiology No results found for this or any previous visit (from the past 240 hour(s)). Imaging ECHOCARDIOGRAM COMPLETE  Result Date: 09/11/2022    ECHOCARDIOGRAM REPORT   Patient Name:   GABRIELLIA HOUSTON Date of Exam: 09/11/2022 Medical Rec #:  [id number]           Height:       60.0 in Accession #:    [id number]          Weight:       135.0 lb Date of Birth:  Jul 11, 1955  BSA:          1.579 m Patient Age:    67 years            BP:           123/79 mmHg Patient Gender: F                   HR:           81 bpm. Exam Location:  [facility] Procedure: 2D Echo Indications:     Stroke I63.9                  TIA G45.9  History:         Patient has no prior history of Echocardiogram examinations.  Sonographer:     Kathlen Brunswick RDCS Referring Phys:  [id number] Emeterio Reeve Diagnosing Phys: Ida Rogue MD  Sonographer Comments: Suboptimal parasternal window. Image acquisition challenging due to respiratory motion. IMPRESSIONS  1. Left ventricular ejection fraction, by estimation, is 55 to 60%. The left ventricle has normal function. The left ventricle has no regional wall motion abnormalities. Left ventricular diastolic parameters are consistent with Grade I diastolic dysfunction (impaired relaxation).  2. Right ventricular systolic function is normal. The right ventricular size is normal. Tricuspid regurgitation signal is inadequate for assessing PA pressure.  3. The mitral valve is normal in structure. Mild mitral valve regurgitation. No evidence of mitral stenosis.  4. The aortic valve was not well visualized. Aortic valve regurgitation is not visualized. No aortic stenosis is present.  5. The inferior vena cava is normal in size with greater than 50% respiratory variability, suggesting right atrial pressure of 3 mmHg. FINDINGS  Left Ventricle: Left ventricular ejection fraction, by estimation, is 55 to 60%. The left ventricle has normal function. The  left ventricle has no regional wall motion abnormalities. The left ventricular internal cavity size was normal in size. There is  no left ventricular hypertrophy. Left ventricular diastolic parameters are consistent with Grade I diastolic dysfunction (impaired relaxation). Right Ventricle: The right ventricular size is normal. No increase in right ventricular wall thickness. Right ventricular systolic function is normal. Tricuspid regurgitation signal is inadequate for assessing PA pressure. Left Atrium: Left atrial size was normal in size. Right Atrium: Right atrial size was normal in size. Pericardium: There is no evidence of pericardial effusion. Mitral Valve: The mitral valve is normal in structure. Mild mitral valve regurgitation. No evidence of mitral valve stenosis. Tricuspid Valve: The tricuspid valve is normal in structure. Tricuspid valve regurgitation is not demonstrated. No evidence of tricuspid stenosis. Aortic Valve: The aortic valve was not well visualized. Aortic valve regurgitation is not visualized. No aortic stenosis is present. Aortic valve peak gradient measures 7.0 mmHg. Pulmonic Valve: The pulmonic valve was normal in structure. Pulmonic valve regurgitation is not visualized. No evidence of pulmonic stenosis. Aorta: The aortic root is normal in size and structure. Venous: The inferior vena cava is normal in size with greater than 50% respiratory variability, suggesting right atrial pressure of 3 mmHg. IAS/Shunts: No atrial level shunt detected by color flow Doppler.  LEFT VENTRICLE PLAX 2D LVIDd:         4.30 cm     Diastology LVIDs:         3.00 cm     LV e' medial:    14.30 cm/s LV PW:         0.90 cm     LV E/e' medial:  6.7 LV IVS:  0.90 cm     LV e' lateral:   11.30 cm/s LVOT diam:     1.60 cm     LV E/e' lateral: 8.5 LV SV:         33 LV SV Index:   21 LVOT Area:     2.01 cm  LV Volumes (MOD) LV vol d, MOD A4C: 38.9 ml LV vol s, MOD A4C: 14.4 ml LV SV MOD A4C:     38.9 ml RIGHT  VENTRICLE RV Basal diam:  1.60 cm RV S prime:     14.70 cm/s TAPSE (M-mode): 1.6 cm LEFT ATRIUM             Index        RIGHT ATRIUM          Index LA diam:        3.00 cm 1.90 cm/m   RA Area:     7.25 cm LA Vol (A2C):   29.6 ml 18.74 ml/m  RA Volume:   13.00 ml 8.23 ml/m LA Vol (A4C):   12.5 ml 7.91 ml/m LA Biplane Vol: 19.2 ml 12.16 ml/m  AORTIC VALVE                 PULMONIC VALVE AV Area (Vmax): 1.55 cm     PV Vmax:        1.22 m/s AV Vmax:        132.00 cm/s  PV Peak grad:   6.0 mmHg AV Peak Grad:   7.0 mmHg     RVOT Peak grad: 2 mmHg LVOT Vmax:      102.00 cm/s LVOT Vmean:     60.200 cm/s LVOT VTI:       0.165 m  AORTA Ao Root diam: 2.40 cm MITRAL VALVE MV Area (PHT): 3.60 cm     SHUNTS MV Decel Time: 211 msec     Systemic VTI:  0.16 m MV E velocity: 95.90 cm/s   Systemic Diam: 1.60 cm MV A velocity: 125.00 cm/s MV E/A ratio:  0.77 Ida Rogue MD Electronically signed by Ida Rogue MD Signature Date/Time: 09/11/2022/10:45:59 AM    Final       Time coordinating discharge: over 30 minutes  SIGNED:  Emeterio Reeve DO [facility]

## 2022-09-11 NOTE — Evaluation (Signed)
Occupational Therapy Evaluation Patient Details Name: AME MCNULTY MRN: [id number] DOB: 1955/08/22 Today's Date: 09/11/2022   History of Present Illness Kellie Johnson is a 67 y.o. female without significant PMH d/t does not like going to doctors. She presents after 3 weeks ago numbness/weakness on L side which spontaneously resolved <24h, then recurred on 3/08. MRI motion degraded, but significant for acute to early subacute infarct involving the posterior limb of the right internal capsule and thalamus.   Clinical Impression   [name] was seen for OT evaluation this date. Prior to hospital admission, pt was MOD I to independent in all aspects of ADL/IADL, occasionally using a 2WW for functional mobility, and denies falls history in past 12 months. Pt lives with her son in a 1 story home with 5 steps to enter. Currently pt reporting symptoms have generally resolved aside from minor LUE weakness and decreased Garden. Pt demonstrates baseline independence to perform ADL and mobility tasks and no strength, sensory, coordination, cognitive, or visual deficits appreciated with assessment. Pt educated on falls prevention, BE FAST stroke response and education and DC recs with education on referral to OP OT if LUE weakness/FMC impact daily life. No further skilled OT needs identified. Will sign off. Please re-consult if additional OT needs arise.       Recommendations for follow up therapy are one component of a multi-disciplinary discharge planning process, led by the attending physician.  Recommendations may be updated based on patient status, additional functional criteria and insurance authorization.   Follow Up Recommendations  No OT follow up     Assistance Recommended at Discharge PRN  Patient can return home with the following      Functional Status Assessment     Equipment Recommendations       Recommendations for Other Services       Precautions / Restrictions  Precautions Precautions: Fall Restrictions Weight Bearing Restrictions: No      Mobility Bed Mobility Overal bed mobility: Independent                  Transfers Overall transfer level: Independent                        Balance Overall balance assessment: No apparent balance deficits (not formally assessed)                                         ADL either performed or assessed with clinical judgement   ADL Overall ADL's : At baseline                                       General ADL Comments: Able to perform functional mobility with MOD I, able to doff don hospital socks while seated EOB independently. Appears at or near baseline level of functional independence.     Vision Patient Visual Report: No change from baseline       Perception     Praxis      Pertinent Vitals/Pain Pain Assessment Pain Assessment: No/denies pain     Hand Dominance Right   Extremity/Trunk Assessment Upper Extremity Assessment Upper Extremity Assessment: Overall WFL for tasks assessed   Lower Extremity Assessment Lower Extremity Assessment: Overall WFL for tasks assessed       Communication  Communication Communication: No difficulties   Cognition Arousal/Alertness: Awake/alert Behavior During Therapy: WFL for tasks assessed/performed, Restless Overall Cognitive Status: Within Functional Limits for tasks assessed                                 General Comments: Eager for DC.     General Comments       Exercises Other Exercises Other Exercises: Pt/caregiver at bedside educated on role of OT in acute Vs. OP setting, safe use of AE for functional mobility and BE FAST stroke education.   Shoulder Instructions      Home Living Family/patient expects to be discharged to:: Private residence Living Arrangements: Children Available Help at Discharge: Family Type of Home: House Home Access: Stairs to  enter Technical brewer of Steps: 5 Entrance Stairs-Rails: Can reach both Home Layout: One level     Bathroom Shower/Tub: Teacher, early years/pre: Standard     Home Equipment: Conservation officer, nature (2 wheels)          Prior Functioning/Environment Prior Level of Function : Independent/Modified Independent               ADLs Comments: Endorses being active and independent, denies falls history in last 6 months.        OT Problem List: Decreased strength;Decreased range of motion;Impaired sensation;Impaired UE functional use      OT Treatment/Interventions:      OT Goals(Current goals can be found in the care plan section) Acute Rehab OT Goals Patient Stated Goal: to go home OT Goal Formulation: All assessment and education complete, DC therapy Time For Goal Achievement: 09/11/22 Potential to Achieve Goals: Good  OT Frequency:      Co-evaluation              AM-PAC OT "6 Clicks" Daily Activity     Outcome Measure Help from another person eating meals?: None Help from another person taking care of personal grooming?: None Help from another person toileting, which includes using toliet, bedpan, or urinal?: None Help from another person bathing (including washing, rinsing, drying)?: None Help from another person to put on and taking off regular upper body clothing?: None Help from another person to put on and taking off regular lower body clothing?: None 6 Click Score: 24   End of Session    Activity Tolerance: Patient tolerated treatment well Patient left: Other (comment) (seated EOB with visitor present as recieved.)  OT Visit Diagnosis: Hemiplegia and hemiparesis Hemiplegia - Right/Left: Left Hemiplegia - dominant/non-dominant: Non-Dominant Hemiplegia - caused by: Cerebral infarction                Time: 1112-1140 OT Time Calculation (min): 28 min Charges:  OT General Charges $OT Visit: 1 Visit OT Evaluation $OT Eval Low Complexity: 1  Low OT Treatments $Self Care/Home Management : 8-22 mins  Shara Blazing, M.S., OTR/L 09/11/22, 12:40 PM

## 2022-09-11 NOTE — Progress Notes (Signed)
Mobility Specialist - Progress Note   09/11/22 0920  Mobility  Activity Ambulated independently to bathroom;Stood at bedside;Dangled on edge of bed  Level of Assistance Independent  Assistive Device Front wheel walker  Distance Ambulated (ft) 10 ft  Activity Response Tolerated well  Mobility Referral Yes  $Mobility charge 1 Mobility   Pt EOB on RA upon arrival. Pt STS and ambulates to/from bathroom indep. Pt returns to EOB with needs in reach.   Gretchen Short  Mobility Specialist  09/11/22 9:21 AM

## 2022-09-11 NOTE — Evaluation (Addendum)
Physical Therapy Evaluation Patient Details Name: Kellie Johnson MRN: [id number] DOB: 1955/10/02 Today's Date: 09/11/2022  History of Present Illness  Morgen C Marschke is a 67 y.o. female without significant PMH d/t does not like going to doctors. She presents after 3 weeks ago numbness/weakness on L side which spontaneously resolved <24h, then recurred on 3/08. MRI motion degraded, but significant for acute to early subacute infarct involving the posterior limb of the right internal capsule and thalamus.    Clinical Impression  Pt sitting upright at EOB upon arrival to the room.  Pt states she is ready to go home today and is looking forward to ambulating with therapist.  Pt states she is in 2/10 pain in her buttock where she fell in the ED yesterday, but is overall doing well.  Pt able to ambulate around the nursing station and perform stair training in the rehab gym with use of singular railing on the R going up/down.  Pt does state she needs a walker for safe mobilization at home.  Current discharge plans to home remain appropriate at this time.  Pt will continue to benefit from skilled therapy in order to address deficits listed below.          Recommendations for follow up therapy are one component of a multi-disciplinary discharge planning process, led by the attending physician.  Recommendations may be updated based on patient status, additional functional criteria and insurance authorization.  Follow Up Recommendations No PT follow up      Assistance Recommended at Discharge PRN  Patient can return home with the following  Help with stairs or ramp for entrance    Equipment Recommendations Rolling walker (2 wheels)  Recommendations for Other Services       Functional Status Assessment Patient has had a recent decline in their functional status and demonstrates the ability to make significant improvements in function in a reasonable and predictable amount of time.      Precautions / Restrictions Restrictions Weight Bearing Restrictions: No      Mobility  Bed Mobility Overal bed mobility: Independent                  Transfers Overall transfer level: Independent                      Ambulation/Gait Ambulation/Gait assistance: Independent Gait Distance (Feet): 240 Feet Assistive device: Rolling walker (2 wheels) Gait Pattern/deviations: Step-through pattern Gait velocity: adequate     General Gait Details: Pt with good technique ambulating with the use of the RW.  Stairs            Wheelchair Mobility    Modified Rankin (Stroke Patients Only)       Balance Overall balance assessment: Needs assistance Sitting-balance support: No upper extremity supported, Feet unsupported Sitting balance-Leahy Scale: Normal     Standing balance support: Bilateral upper extremity supported, During functional activity Standing balance-Leahy Scale: Good                               Pertinent Vitals/Pain Pain Assessment Pain Assessment: 0-10 Pain Score: 2  Pain Location: Buttocks from her fall in the ED Pain Descriptors / Indicators: Sore Pain Intervention(s): Repositioned    Home Living Family/patient expects to be discharged to:: Private residence Living Arrangements: Children (Son lives with her.) Available Help at Discharge: Family Type of Home: House Home Access: Stairs to enter Entrance Stairs-Rails:  Can reach both Entrance Stairs-Number of Steps: 5   Home Layout: One level Home Equipment: Conservation officer, nature (2 wheels)      Prior Function Prior Level of Function : Independent/Modified Independent                     Hand Dominance   Dominant Hand: Right    Extremity/Trunk Assessment   Upper Extremity Assessment Upper Extremity Assessment: Overall WFL for tasks assessed    Lower Extremity Assessment Lower Extremity Assessment: Overall WFL for tasks assessed       Communication    Communication: No difficulties  Cognition Arousal/Alertness: Awake/alert Behavior During Therapy: WFL for tasks assessed/performed Overall Cognitive Status: Within Functional Limits for tasks assessed                                          General Comments      Exercises     Assessment/Plan    PT Assessment Patient does not need any further PT services  PT Problem List         PT Treatment Interventions      PT Goals (Current goals can be found in the Care Plan section)  Acute Rehab PT Goals PT Goal Formulation: All assessment and education complete, DC therapy Time For Goal Achievement: 09/11/22 Potential to Achieve Goals: Good    Frequency       Co-evaluation               AM-PAC PT "6 Clicks" Mobility  Outcome Measure Help needed turning from your back to your side while in a flat bed without using bedrails?: None Help needed moving from lying on your back to sitting on the side of a flat bed without using bedrails?: None Help needed moving to and from a bed to a chair (including a wheelchair)?: None Help needed standing up from a chair using your arms (e.g., wheelchair or bedside chair)?: None Help needed to walk in hospital room?: A Little Help needed climbing 3-5 steps with a railing? : A Little 6 Click Score: 22    End of Session Equipment Utilized During Treatment: Gait belt Activity Tolerance: Patient tolerated treatment well Patient left: in bed;with family/visitor present Nurse Communication: Mobility status PT Visit Diagnosis: Muscle weakness (generalized) (M62.81);History of falling (Z91.81);Difficulty in walking, not elsewhere classified (R26.2)    Time: [id number] PT Time Calculation (min) (ACUTE ONLY): 15 min   Charges:   PT Evaluation $PT Eval Low Complexity: 1 Low          Gwenlyn Saran, PT, DPT Physical Therapist - [facility]  09/11/22, 11:19 AM

## 2022-09-11 NOTE — Plan of Care (Signed)

## 2022-09-11 NOTE — Hospital Course (Addendum)
Kellie Johnson is a 67 y.o. female without significant PMH d/t does not like going to doctors. She presents after 3 weeks ago numbness/weakness on L side which spontaneously resolved <24h, then recurred yesterday evening and persists until now. She presented to ED 09/10/22, given ASA< CT head negative, L sided weakness and was admitted to hospitalist service    Consultants:  Neurology   Procedures: none  MRI Brain 09/10/22  IMPRESSION: 1. Motion degraded, incomplete examination. 2. 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 3. Moderate chronic small vessel ischemic disease.   CTA H/N 09/10/22  IMPRESSION:  Patent vasculature of the head and neck with mild calcified plaque at the left carotid bifurcation and bilateral carotid siphons but no hemodynamically significant stenosis, occlusion, or dissection    ASSESSMENT & PLAN:   Principal Problem:   Stroke [facility]) Active Problems:   GERD (gastroesophageal reflux disease)   Mixed hyperlipidemia   Abnormal glucose - A1C x1 in prediabetic range    Stroke MRI and CTA H/N as above ASA + Plavix Statin  Neuro checks and fall precautions PT/OT to see  Echo pending Remain on telemetry  Risk stratification: A1C, Lipids Permissive HTN Neurology consult    HLD in setting of CVA High potency statin  Will require outpatient f/u  Abn Glc level - A1C 5.9 Will require outpatient f/u   GERD GI cocktail as needed   Anxiety Requiring pre-medication for MRI Prn anxiolytic also ordered   Nasal congestion Saline spray prn          DVT prophylaxis: lovenox Pertinent IV fluids/nutrition: no IV fluids  Central lines / invasive devices: none   Code Status: FULL CODE   Disposition: observation TOC needs: pend PT/OT, pt needs PCP Barriers to discharge / significant pending items: echo, telemetry, neurology consult

## 2022-09-11 NOTE — Progress Notes (Signed)
  Echocardiogram 2D Echocardiogram has been performed.  Claretta Fraise 09/11/2022, 10:27 AM

## 2022-09-11 NOTE — Discharge Instructions (Signed)
ESTABLISH ASAP WITH A PRIMARY CARE DOCTORS OFFICE. YOU WILL NEED BLOOD WORK RECHECKED IN 4-6 WEEKS AND REFILLS ON MEDICATIONS, FOLLOW UP STROKE, FOLLOW UP ACID REFLUX.

## 2022-09-11 NOTE — TOC Initial Note (Signed)
Transition of Care [facility]) - Initial/Assessment Note    Patient Details  Name: Kellie Johnson MRN: [id number] Date of Birth: [birth year]  Transition of Care [facility]) CM/SW Contact:    Loreta Ave, [place] Phone Number: 09/11/2022, 11:51 AM  Clinical Narrative:                  CSW received request from RN that pt needed DME, spoke with [place] at [place], walker ordered, to be delivered to the room within two hours, RN made aware.   Expected Discharge Plan: Home/Self Care Barriers to Discharge: Continued Medical Work up   Patient Goals and CMS Choice Patient states their goals for this hospitalization and ongoing recovery are:: Get stronger soon CMS Medicare.gov Compare Post Acute Care list provided to:: Patient Choice offered to / list presented to : Patient [place] ownership interest in [facility].provided to:: Patient    Expected Discharge Plan and Services In-house Referral: Clinical Social Work   Post Acute Care Choice: Durable Medical Equipment   Expected Discharge Date: 09/11/22               DME Arranged: Gilford Rile DME Agency: [employer] Date DME Agency Contacted: 09/11/22 Time DME Agency Contacted: 72 Representative spoke with at DME Agency: Delana Meyer            Prior Living Arrangements/Services                       Activities of Daily Living Home Assistive Devices/Equipment: Environmental consultant (specify type) ADL Screening (condition at time of admission) Patient's cognitive ability adequate to safely complete daily activities?: Yes Is the patient deaf or have difficulty hearing?: No Does the patient have difficulty seeing, even when wearing glasses/contacts?: No Does the patient have difficulty concentrating, remembering, or making decisions?: No Patient able to express need for assistance with ADLs?: Yes Does the patient have difficulty dressing or bathing?: No Independently performs ADLs?: No Communication: Independent Dressing (OT):  Independent Grooming: Independent Feeding: Independent Bathing: Independent Toileting: Needs assistance Is this a change from baseline?: Change from baseline, expected to last <3 days In/Out Bed: Needs assistance Is this a change from baseline?: Change from baseline, expected to last <3 days Walks in Home: Needs assistance Is this a change from baseline?: Change from baseline, expected to last <3 days Does the patient have difficulty walking or climbing stairs?: Yes Weakness of Legs: Both Weakness of Arms/Hands: None  Permission Sought/Granted                  Emotional Assessment              Admission diagnosis:  Paresthesias [R20.2] Weakness [R53.1] Stroke ([place]) [I63.9] Stroke-like symptoms [R29.90] Patient Active Problem List   Diagnosis Date Noted   GERD (gastroesophageal reflux disease) 09/11/2022   Mixed hyperlipidemia 09/11/2022   Abnormal glucose - A1C x1 in prediabetic range 09/11/2022   Stroke ([place]) 09/10/2022   PCP:  Patient, No Pcp Per Pharmacy:   [facility] [id number] [place] [address] Phone: [phone] Fax: [phone]     Social Determinants of Health (SDOH) Social History: SDOH Screenings   Food Insecurity: No Food Insecurity (09/10/2022)  Housing: Low Risk  (09/10/2022)  Transportation Needs: No Transportation Needs (09/10/2022)  Utilities: Not At Risk (09/10/2022)  Tobacco Use: High Risk (09/10/2022)   SDOH Interventions:  Readmission Risk Interventions     No data to display

## 2022-09-11 NOTE — Consult Note (Signed)
Neurology Consultation Reason for Consult: Stroke on MRI Requesting Physician: Emeterio Reeve  CC: Left sided numbness   History is obtained from: Patient and chart review   HPI: Kellie Johnson is a 67 y.o. female with a past medical history of smoking, avoidance of medical care, hypertension and hyperlipidemia and prediabetes at this admission, presenting with left-sided sensory symptoms and mild weakness  Overall she minimizes her symptoms, reports her sensory symptoms have fully resolved at this time, denies weakness but when pushed does note some left upper extremity ataxia and mild gait difficulty.  She reports she had symptoms briefly 3 weeks ago which improved with heat and muscle rub.  However she then again had recurrent symptoms starting at 7:30 PM on 3/7.  She was reluctant to present for medical care due to her mother having a missed diagnosis of melanoma in the past but was eventually convinced to present to the hospital by family.  Neurology was consulted for stroke management  LKW: 7:30 PM 3/7 Thrombolytic given?: No, out of the window IA performed?: No, no LVO Premorbid modified rankin scale:      0 - No symptoms.   ROS: All other review of systems was negative except as noted in the HPI.   History reviewed. No pertinent past medical history. -Avoids medical care, see HPI above  Family history: -Sister with tremor -Mother with melanoma  Social History:  reports that she has been smoking cigarettes. She has never used smokeless tobacco. She reports that she does not drink alcohol and does not use drugs.  Exam: Current vital signs: BP (!) 134/52 (BP Location: Left Arm)   Pulse 87   Temp 98.7 F (37.1 C) (Oral)   Resp 17   Ht 5' (1.524 m)   Wt 61.2 kg   SpO2 96%   BMI 26.37 kg/m  Vital signs in last 24 hours: Temp:  [97.5 F (36.4 C)-98.7 F (37.1 C)] 98.7 F (37.1 C) (03/09 0755) Pulse Rate:  [87-104] 87 (03/09 0755) Resp:  [16-20] 17 (03/09  0755) BP: (111-164)/(52-85) 134/52 (03/09 0755) SpO2:  [94 %-99 %] 96 % (03/09 0755)   Physical Exam  Constitutional: Appears well-developed and well-nourished.  Psych: Affect appropriate to situation, mildly irritable but cooperative Eyes: No scleral injection HENT: No oropharyngeal obstruction.  MSK: no major joint deformities.  Cardiovascular: Perfusing extremities well Respiratory: Effort normal, non-labored breathing GI: Soft.  No distension. There is no tenderness.  Skin: Warm dry and intact visible skin  Neuro: Mental Status: Patient is awake, alert, oriented to person, place, month, year, and situation. Patient is able to give a clear and coherent history. No signs of aphasia or neglect Cranial Nerves: II: Visual Fields are full. Pupils are equal, round, and reactive to light.   III,IV, VI: EOMI without ptosis or diploplia.  Reports slight double vision on right gaze V: Facial sensation is symmetric to light touch VII: Facial movement is symmetric.  VIII: hearing is intact to voice X: Uvula elevates symmetrically XI: Shoulder shrug is symmetric. XII: tongue is midline without atrophy or fasciculations.  Motor: Tone is normal. Bulk is normal. 5/5 strength was present in all four extremities, other than bilateral 4+/5 deltoids, left hip flexion 4/5.  Subtle left upper extremity pronator drift Sensory: Sensation is symmetric to light touch and temperature in the arms and legs. Cerebellar: Slight ataxia with finger-to-nose bilaterally, worse on the left than the right.  Heel-to-shin intact bilaterally Gait:  Slightly unsteady gait, able to rise on  heels and toes.  Unsteady tandem gait  NIHSS total 1 Score breakdown: Left upper extremity drift Performed at 11:15 AM   I have reviewed labs in epic and the results pertinent to this consultation are:  Basic Metabolic Panel: Recent Labs  Lab 09/10/22 1001  NA 135  K 4.0  CL 102  CO2 24  GLUCOSE 112*  BUN 11   CREATININE 0.76  CALCIUM 9.2  MG 2.7*    CBC: Recent Labs  Lab 09/10/22 1001  WBC 7.1  HGB 14.3  HCT 43.5  MCV 91.4  PLT 246    Coagulation Studies: No results for input(s): "LABPROT", "INR" in the last 72 hours.   Lab Results  Component Value Date   HGBA1C 5.9 (H) 09/10/2022   Lab Results  Component Value Date   CHOL 300 (H) 09/11/2022   HDL 46 09/11/2022   LDLCALC 184 (H) 09/11/2022   TRIG 351 (H) 09/11/2022   CHOLHDL 6.5 09/11/2022    I have reviewed the images obtained:  MRI brain personally reviewed, agree with radiology:   1. Motion degraded, incomplete examination. 2. 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 3. Moderate chronic small vessel ischemic disease.  CTA personally reviewed, agree with radiology:   Patent vasculature of the head and neck with mild calcified plaque at the left carotid bifurcation and bilateral carotid siphons but no hemodynamically significant stenosis, occlusion, or dissection.   Head CT personally reviewed, agree with radiology:   No evidence of acute intracranial abnormality.   ECHO  1. Left ventricular ejection fraction, by estimation, is 55 to 60%. The  left ventricle has normal function. The left ventricle has no regional  wall motion abnormalities. Left ventricular diastolic parameters are  consistent with Grade I diastolic  dysfunction (impaired relaxation).   2. Right ventricular systolic function is normal. The right ventricular  size is normal. Tricuspid regurgitation signal is inadequate for assessing  PA pressure.   3. The mitral valve is normal in structure. Mild mitral valve  regurgitation. No evidence of mitral stenosis.   4. The aortic valve was not well visualized. Aortic valve regurgitation  is not visualized. No aortic stenosis is present.   5. The inferior vena cava is normal in size with greater than 50%  respiratory variability,  suggesting right atrial pressure of 3 mmHg.   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 Discussed that nicotine withdrawal symptoms peak in the first three days of smoking cessation and subside over the next three to four weeks. Discussed options including counseling, pharmacological options including Nicotine patch, Chantix, Wellbutrin. We discussed quit date. I also referred patient to resources and free resources offered by the state of [place] on "http://www.hall-gray.net/". Patient will also reach out to PCP on followup if needed, for now reports she prefers to stop on her own.  Lesleigh Noe MD-PhD [facility] [phone] [facility] coverage for [place] is from 8 AM to 4 AM  in-house and 4 PM to 8 PM by telephone/video. 8 PM to 8 AM emergent questions or overnight urgent questions should be addressed to Teleneurology On-call or [facility] neurohospitalist; contact information can be found on AMION  Recommendations conveyed to primary team via secure chat and at bedside

## 2022-09-28 DIAGNOSIS — Z1211 Encounter for screening for malignant neoplasm of colon: Secondary | ICD-10-CM | POA: Diagnosis not present

## 2022-09-28 DIAGNOSIS — J302 Other seasonal allergic rhinitis: Secondary | ICD-10-CM | POA: Diagnosis not present

## 2022-09-28 DIAGNOSIS — K219 Gastro-esophageal reflux disease without esophagitis: Secondary | ICD-10-CM | POA: Diagnosis not present

## 2022-09-28 DIAGNOSIS — Z780 Asymptomatic menopausal state: Secondary | ICD-10-CM | POA: Diagnosis not present

## 2022-09-28 DIAGNOSIS — Z1231 Encounter for screening mammogram for malignant neoplasm of breast: Secondary | ICD-10-CM | POA: Diagnosis not present

## 2022-09-28 DIAGNOSIS — R030 Elevated blood-pressure reading, without diagnosis of hypertension: Secondary | ICD-10-CM | POA: Diagnosis not present

## 2022-09-28 DIAGNOSIS — I639 Cerebral infarction, unspecified: Secondary | ICD-10-CM | POA: Diagnosis not present

## 2022-10-25 DIAGNOSIS — Z780 Asymptomatic menopausal state: Secondary | ICD-10-CM | POA: Diagnosis not present

## 2022-10-25 DIAGNOSIS — Z1231 Encounter for screening mammogram for malignant neoplasm of breast: Secondary | ICD-10-CM | POA: Diagnosis not present

## 2022-10-25 DIAGNOSIS — M810 Age-related osteoporosis without current pathological fracture: Secondary | ICD-10-CM | POA: Diagnosis not present

## 2022-11-16 DIAGNOSIS — E785 Hyperlipidemia, unspecified: Secondary | ICD-10-CM | POA: Diagnosis not present

## 2022-11-16 DIAGNOSIS — K219 Gastro-esophageal reflux disease without esophagitis: Secondary | ICD-10-CM | POA: Diagnosis not present

## 2022-11-16 DIAGNOSIS — Z712 Person consulting for explanation of examination or test findings: Secondary | ICD-10-CM | POA: Diagnosis not present

## 2022-11-16 DIAGNOSIS — M810 Age-related osteoporosis without current pathological fracture: Secondary | ICD-10-CM | POA: Diagnosis not present

## 2022-11-30 DIAGNOSIS — I639 Cerebral infarction, unspecified: Secondary | ICD-10-CM | POA: Diagnosis not present

## 2022-11-30 DIAGNOSIS — K219 Gastro-esophageal reflux disease without esophagitis: Secondary | ICD-10-CM | POA: Diagnosis not present

## 2022-11-30 DIAGNOSIS — M810 Age-related osteoporosis without current pathological fracture: Secondary | ICD-10-CM | POA: Diagnosis not present

## 2022-11-30 DIAGNOSIS — R030 Elevated blood-pressure reading, without diagnosis of hypertension: Secondary | ICD-10-CM | POA: Diagnosis not present

## 2022-11-30 DIAGNOSIS — E785 Hyperlipidemia, unspecified: Secondary | ICD-10-CM | POA: Diagnosis not present

## 2022-11-30 DIAGNOSIS — Z780 Asymptomatic menopausal state: Secondary | ICD-10-CM | POA: Diagnosis not present
